# Patient Record
Sex: Male | Born: 1980 | Race: White | Hispanic: No | Marital: Married | Smoking: Current every day smoker
Health system: Southern US, Community
[De-identification: ages and names within clinical notes are randomized; demographics above are authoritative.]

## PROBLEM LIST (undated history)

## (undated) DIAGNOSIS — I509 Heart failure, unspecified: Secondary | ICD-10-CM

## (undated) DIAGNOSIS — G4730 Sleep apnea, unspecified: Secondary | ICD-10-CM

## (undated) DIAGNOSIS — J449 Chronic obstructive pulmonary disease, unspecified: Secondary | ICD-10-CM

---

## 2019-01-21 ENCOUNTER — Encounter (HOSPITAL_COMMUNITY): Payer: Self-pay | Admitting: *Deleted

## 2019-01-21 ENCOUNTER — Emergency Department (HOSPITAL_COMMUNITY)
Admission: EM | Admit: 2019-01-21 | Discharge: 2019-01-22 | Disposition: A | Discharge status: Home / Self Care | Payer: Medicaid Other | Attending: Emergency Medicine | Admitting: Emergency Medicine

## 2019-01-21 ENCOUNTER — Emergency Department (HOSPITAL_COMMUNITY): Payer: Medicaid Other

## 2019-01-21 DIAGNOSIS — I509 Heart failure, unspecified: Secondary | ICD-10-CM | POA: Diagnosis not present

## 2019-01-21 DIAGNOSIS — N5089 Other specified disorders of the male genital organs: Secondary | ICD-10-CM | POA: Diagnosis not present

## 2019-01-21 DIAGNOSIS — R600 Localized edema: Secondary | ICD-10-CM

## 2019-01-21 DIAGNOSIS — J449 Chronic obstructive pulmonary disease, unspecified: Secondary | ICD-10-CM | POA: Insufficient documentation

## 2019-01-21 DIAGNOSIS — I110 Hypertensive heart disease with heart failure: Secondary | ICD-10-CM | POA: Insufficient documentation

## 2019-01-21 DIAGNOSIS — R2243 Localized swelling, mass and lump, lower limb, bilateral: Secondary | ICD-10-CM | POA: Insufficient documentation

## 2019-01-21 DIAGNOSIS — I10 Essential (primary) hypertension: Secondary | ICD-10-CM

## 2019-01-21 DIAGNOSIS — Z91199 Patient's noncompliance with other medical treatment and regimen due to unspecified reason: Secondary | ICD-10-CM

## 2019-01-21 DIAGNOSIS — F17210 Nicotine dependence, cigarettes, uncomplicated: Secondary | ICD-10-CM | POA: Insufficient documentation

## 2019-01-21 DIAGNOSIS — R739 Hyperglycemia, unspecified: Secondary | ICD-10-CM | POA: Insufficient documentation

## 2019-01-21 DIAGNOSIS — R0602 Shortness of breath: Secondary | ICD-10-CM | POA: Diagnosis present

## 2019-01-21 DIAGNOSIS — Z9119 Patient's noncompliance with other medical treatment and regimen: Secondary | ICD-10-CM

## 2019-01-21 HISTORY — DX: Chronic obstructive pulmonary disease, unspecified: J44.9

## 2019-01-21 HISTORY — DX: Sleep apnea, unspecified: G47.30

## 2019-01-21 HISTORY — DX: Heart failure, unspecified: I50.9

## 2019-01-21 LAB — BASIC METABOLIC PANEL
Anion gap: 10 (ref 5–15)
BUN: 11 mg/dL (ref 6–20)
CO2: 30 mmol/L (ref 22–32)
Calcium: 8.6 mg/dL — ABNORMAL LOW (ref 8.9–10.3)
Chloride: 97 mmol/L — ABNORMAL LOW (ref 98–111)
Creatinine, Ser: 0.95 mg/dL (ref 0.61–1.24)
GFR calc Af Amer: >60 mL/min (ref >60)
GFR calc non Af Amer: >60 mL/min (ref >60)
Glucose, Bld: 264 mg/dL — ABNORMAL HIGH (ref 70–99)
Potassium: 4 mmol/L (ref 3.5–5.1)
Sodium: 137 mmol/L (ref 135–145)

## 2019-01-21 LAB — CBC
HCT: 50.8 % (ref 39.0–52.0)
Hemoglobin: 15.5 g/dL (ref 13.0–17.0)
MCH: 25.7 pg — ABNORMAL LOW (ref 26.0–34.0)
MCHC: 30.5 g/dL (ref 30.0–36.0)
MCV: 84.2 fL (ref 80.0–100.0)
Platelets: 147 10*3/uL — ABNORMAL LOW (ref 150–400)
RBC: 6.03 MIL/uL — ABNORMAL HIGH (ref 4.22–5.81)
RDW: 17.8 % — ABNORMAL HIGH (ref 11.5–15.5)
WBC: 8.1 10*3/uL (ref 4.0–10.5)
nRBC: 0 % (ref 0.0–0.2)

## 2019-01-21 LAB — TROPONIN I (HIGH SENSITIVITY): Troponin I (High Sensitivity): 12 ng/L (ref <18)

## 2019-01-21 LAB — BRAIN NATRIURETIC PEPTIDE: B Natriuretic Peptide: 26.6 pg/mL (ref 0.0–100.0)

## 2019-01-21 MED ORDER — SODIUM CHLORIDE 0.9% FLUSH: 3.0000 mL | Freq: Once | INTRAVENOUS | Status: DC

## 2019-01-21 NOTE — ED Triage Notes (Signed)
Pt here pov for sob.  States was admitted for heart failure in Aug where they removed 40 lbs of fluid.  Did not follow up with cardiology and has only been taking part of his meds.  Was seen by pcp today, who sent him here.  States LE and abdominal edema and sob.  Pt has 70 lbs weight gain since last hospital stay.

## 2019-01-21 NOTE — ED Notes (Signed)
Pt O2 sat 84%, triage RN notified, pt placed on 2L improved to 92%.

## 2019-01-22 LAB — TROPONIN I (HIGH SENSITIVITY): Troponin I (High Sensitivity): 11 ng/L (ref <18)

## 2019-01-22 MED ORDER — METFORMIN HCL 500 MG PO TABS: 500.0000 mg | ORAL_TABLET | Freq: Two times a day (BID) | ORAL | 0 refills | Status: AC

## 2019-01-22 MED ORDER — FUROSEMIDE 20 MG PO TABS: 20.0000 mg | ORAL_TABLET | Freq: Every day | ORAL | 0 refills | Status: AC

## 2019-01-22 MED ORDER — AMLODIPINE BESYLATE 5 MG PO TABS
10.0000 mg | ORAL_TABLET | Freq: Once | ORAL | Status: AC
  Administered 2019-01-22: 11:00:00 10 mg via ORAL
  Filled 2019-01-22: qty 2

## 2019-01-22 MED ORDER — FUROSEMIDE 20 MG PO TABS
80.0000 mg | ORAL_TABLET | Freq: Once | ORAL | Status: AC
  Administered 2019-01-22: 11:00:00 80 mg via ORAL
  Filled 2019-01-22: qty 4

## 2019-01-22 MED ORDER — LISINOPRIL 5 MG PO TABS: 10.0000 mg | ORAL_TABLET | Freq: Every day | ORAL | 0 refills | Status: AC

## 2019-01-22 MED ORDER — FUROSEMIDE 10 MG/ML IJ SOLN: 60.0000 mg | Freq: Once | INTRAMUSCULAR | Status: DC

## 2019-01-22 MED ORDER — AMLODIPINE BESYLATE 10 MG PO TABS: 10.0000 mg | ORAL_TABLET | Freq: Every day | ORAL | 0 refills | Status: AC

## 2019-01-22 NOTE — ED Provider Notes (Signed)
[facility] EMERGENCY DEPARTMENT Provider Note   CSN: [id number] Arrival date & time: 01/21/19  1620     History Chief Complaint  Patient presents with  . Shortness of Breath    Jason Roman is a 39 y.o. male.  Patient c/o progressive weight increase in the past 5 months. States was briefly hospitalized in 07/2018 with excess fluid, and was treated with lasix then - states since d/c from hospital, not compliant w bp meds, lasix, heart healthy diet and pcp f/u, and that has slowly had progressive weight increase and fluid retention since. No acute or abrupt change today or this week. Patient with bil leg and scrotal swelling. ?orthopnea. No pnd. +dyspnea with exertion.  No current or recent chest pain or discomfort. No fever or chills. Symptoms have been gradual onset, moderate, persistent, slowly worse.   The history is provided by the patient.  Shortness of Breath Associated symptoms: no abdominal pain, no chest pain, no cough, no fever, no headaches, no neck pain, no rash, no sore throat and no vomiting        Past Medical History:  Diagnosis Date  . CHF (congestive heart failure) (HCC)   . COPD (chronic obstructive pulmonary disease) (HCC)   . Sleep apnea     There are no problems to display for this patient.   History reviewed. No pertinent surgical history.     No family history on file.  Social History   Tobacco Use  . Smoking status: Current Every Day Smoker    Packs/day: 2.00    Types: Cigarettes  . Smokeless tobacco: Never Used  Substance Use Topics  . Alcohol use: Yes    Comment: occ  . Drug use: Never    Home Medications Prior to Admission medications   Not on File    Allergies    Vancomycin  Review of Systems   Review of Systems  Constitutional: Negative for fever.  HENT: Negative for sore throat.   Eyes: Negative for redness.  Respiratory: Positive for shortness of breath. Negative for cough.   Cardiovascular:  Positive for leg swelling. Negative for chest pain and palpitations.  Gastrointestinal: Negative for abdominal pain, constipation, diarrhea and vomiting.  Genitourinary: Negative for flank pain.  Musculoskeletal: Negative for back pain and neck pain.  Skin: Negative for rash.  Neurological: Negative for headaches.  Hematological: Does not bruise/bleed easily.  Psychiatric/Behavioral: Negative for confusion.    Physical Exam Updated Vital Signs BP (!) 154/108 (BP Location: Left Arm)   Pulse 81   Temp 98 F (36.7 C) (Oral)   Resp 20   Ht 1.753 m (5\' 9" )   Wt (!) 167.8 kg   SpO2 92%   BMI 54.64 kg/m   Physical Exam Vitals and nursing note reviewed.  Constitutional:      Appearance: Normal appearance. He is well-developed.  HENT:     Head: Atraumatic.     Nose: Nose normal.     Mouth/Throat:     Mouth: Mucous membranes are moist.     Pharynx: Oropharynx is clear.  Eyes:     General: No scleral icterus.    Conjunctiva/sclera: Conjunctivae normal.  Neck:     Trachea: No tracheal deviation.  Cardiovascular:     Rate and Rhythm: Normal rate and regular rhythm.     Pulses: Normal pulses.     Heart sounds: Normal heart sounds. No murmur. No friction rub. No gallop.   Pulmonary:     Effort: Pulmonary  effort is normal. No accessory muscle usage or respiratory distress.     Breath sounds: Normal breath sounds.  Abdominal:     General: Bowel sounds are normal. There is no distension.     Palpations: Abdomen is soft.     Tenderness: There is no abdominal tenderness. There is no guarding.     Comments: Obese.  Genitourinary:    Comments: No cva tenderness. Musculoskeletal:     Cervical back: Normal range of motion and neck supple. No rigidity.     Right lower leg: Edema present.     Left lower leg: Edema present.     Comments: Symmetric bil leg and scrotal swelling.   Skin:    General: Skin is warm and dry.     Findings: No rash.  Neurological:     Mental Status: He is  alert.     Comments: Alert, speech clear.   Psychiatric:        Mood and Affect: Mood normal.     ED Results / Procedures / Treatments   Labs (all labs ordered are listed, but only abnormal results are displayed) Results for orders placed or performed during the hospital encounter of 01/21/19  Basic metabolic panel  Result Value Ref Range   Sodium 137 135 - 145 mmol/L   Potassium 4.0 3.5 - 5.1 mmol/L   Chloride 97 (L) 98 - 111 mmol/L   CO2 30 22 - 32 mmol/L   Glucose, Bld 264 (H) 70 - 99 mg/dL   BUN 11 6 - 20 mg/dL   Creatinine, Ser 2.59 0.61 - 1.24 mg/dL   Calcium 8.6 (L) 8.9 - 10.3 mg/dL   GFR calc non Af Amer >60 >60 mL/min   GFR calc Af Amer >60 >60 mL/min   Anion gap 10 5 - 15  CBC  Result Value Ref Range   WBC 8.1 4.0 - 10.5 K/uL   RBC 6.03 (H) 4.22 - 5.81 MIL/uL   Hemoglobin 15.5 13.0 - 17.0 g/dL   HCT 56.3 87.5 - 64.3 %   MCV 84.2 80.0 - 100.0 fL   MCH 25.7 (L) 26.0 - 34.0 pg   MCHC 30.5 30.0 - 36.0 g/dL   RDW 32.9 (H) 51.8 - 84.1 %   Platelets 147 (L) 150 - 400 K/uL   nRBC 0.0 0.0 - 0.2 %  Brain natriuretic peptide  Result Value Ref Range   B Natriuretic Peptide 26.6 0.0 - 100.0 pg/mL  Troponin I (High Sensitivity)  Result Value Ref Range   Troponin I (High Sensitivity) 12 <18 ng/L  Troponin I (High Sensitivity)  Result Value Ref Range   Troponin I (High Sensitivity) 11 <18 ng/L   DG Chest 2 View  Result Date: 01/21/2019 CLINICAL DATA:  Shortness of breath EXAM: CHEST - 2 VIEW COMPARISON:  None. FINDINGS: There is borderline cardiomegaly with mild volume overload. There is no large pneumothorax. No large pleural effusion. No focal infiltrate. IMPRESSION: Borderline cardiomegaly with mild volume overload. Electronically Signed   By: Katherine Mantle M.D.   On: 01/21/2019 17:06    EKG EKG Interpretation  Date/Time:  Monday January 21 2019 16:32:40 EST Ventricular Rate:  89 PR Interval:  156 QRS Duration: 96 QT Interval:  378 QTC Calculation: 459 R  Axis:   -74 Text Interpretation: Normal sinus rhythm Left axis deviation Abnormal ECG No old tracing to compare Confirmed by Dione Booze ([id number]) on 01/22/2019 1:20:07 AM   Radiology DG Chest 2 View  Result Date: 01/21/2019 CLINICAL  DATA:  Shortness of breath EXAM: CHEST - 2 VIEW COMPARISON:  None. FINDINGS: There is borderline cardiomegaly with mild volume overload. There is no large pneumothorax. No large pleural effusion. No focal infiltrate. IMPRESSION: Borderline cardiomegaly with mild volume overload. Electronically Signed   By: Constance Holster M.D.   On: 01/21/2019 17:06    Procedures Procedures (including critical care time)  Medications Ordered in ED Medications  sodium chloride flush (NS) 0.9 % injection 3 mL (has no administration in time range)  furosemide (LASIX) injection 60 mg (has no administration in time range)  amLODipine (NORVASC) tablet 10 mg (has no administration in time range)    ED Course  I have reviewed the triage vital signs and the nursing notes.  Pertinent labs & imaging results that were available during my care of the patient were reviewed by me and considered in my medical decision making (see chart for details).    MDM Rules/Calculators/A&P                      Iv ns. Labs sent. Monitor/pulse ox.   Reviewed nursing notes and prior charts for additional history.   Patient is drinking one regular [facility], and has another empty bottle on stretcher. Pt notes not compliant w medical therapy, meds, diet, and pcp f/u. States is from [place], and does have pcp, but has states hasnt seen recently. Discussed importance of meds, heart healthy eating plan, and f/u for recheck, bp and glucose management, etc.   Will give dose iv lasix in ED.   Labs reviewed/interpreted by me - bnp is normal. hgb normal. Glucose mod elev, hc03 normal. Trop normal.   Bp is elevated - in addition to lasix, patient given dose of his bp medication.   Patient refuses iv  meds/admission, requests po meds and d/c.   Patients room air pulse ox is now 94-96%, he is breathing comfortably, talking in complete sentences, and talking on cell phone.   Rec close pcp f/u, medication and eating plan compliance.  Return precautions provided.    Final Clinical Impression(s) / ED Diagnoses Final diagnoses:  None    Rx / DC Orders ED Discharge Orders    None       Lajean Saver, MD 01/22/19 [id number]

## 2019-01-22 NOTE — ED Notes (Signed)
Pt currently in hallway and is concerned about getting a room and a meal.   Would like to talk to his wife before proceeding with care at this time.  Alert and oriented in NAD.  Currently on oxygen.

## 2019-01-22 NOTE — Discharge Instructions (Addendum)
It was our pleasure to provide your ER care today - we hope that you feel better.  Take your medications as prescribed. Follow a diabetic, heart healthy, low sodium meal plan.   Follow up with primary care doctor in the coming week - call office today to arrange appointment. Make sure to have your blood pressure and blood sugar rechecked then, as both are high today.   Return to ER if worse, new symptoms, increased trouble breathing, chest pain, or other concern.

## 2020-06-24 IMAGING — DX DG CHEST 2V
2 series · 2 of 2 positions shown · non-contrast
Comparison: None.

CLINICAL DATA: Shortness of breath

EXAM:
CHEST - 2 VIEW

[chest pa]
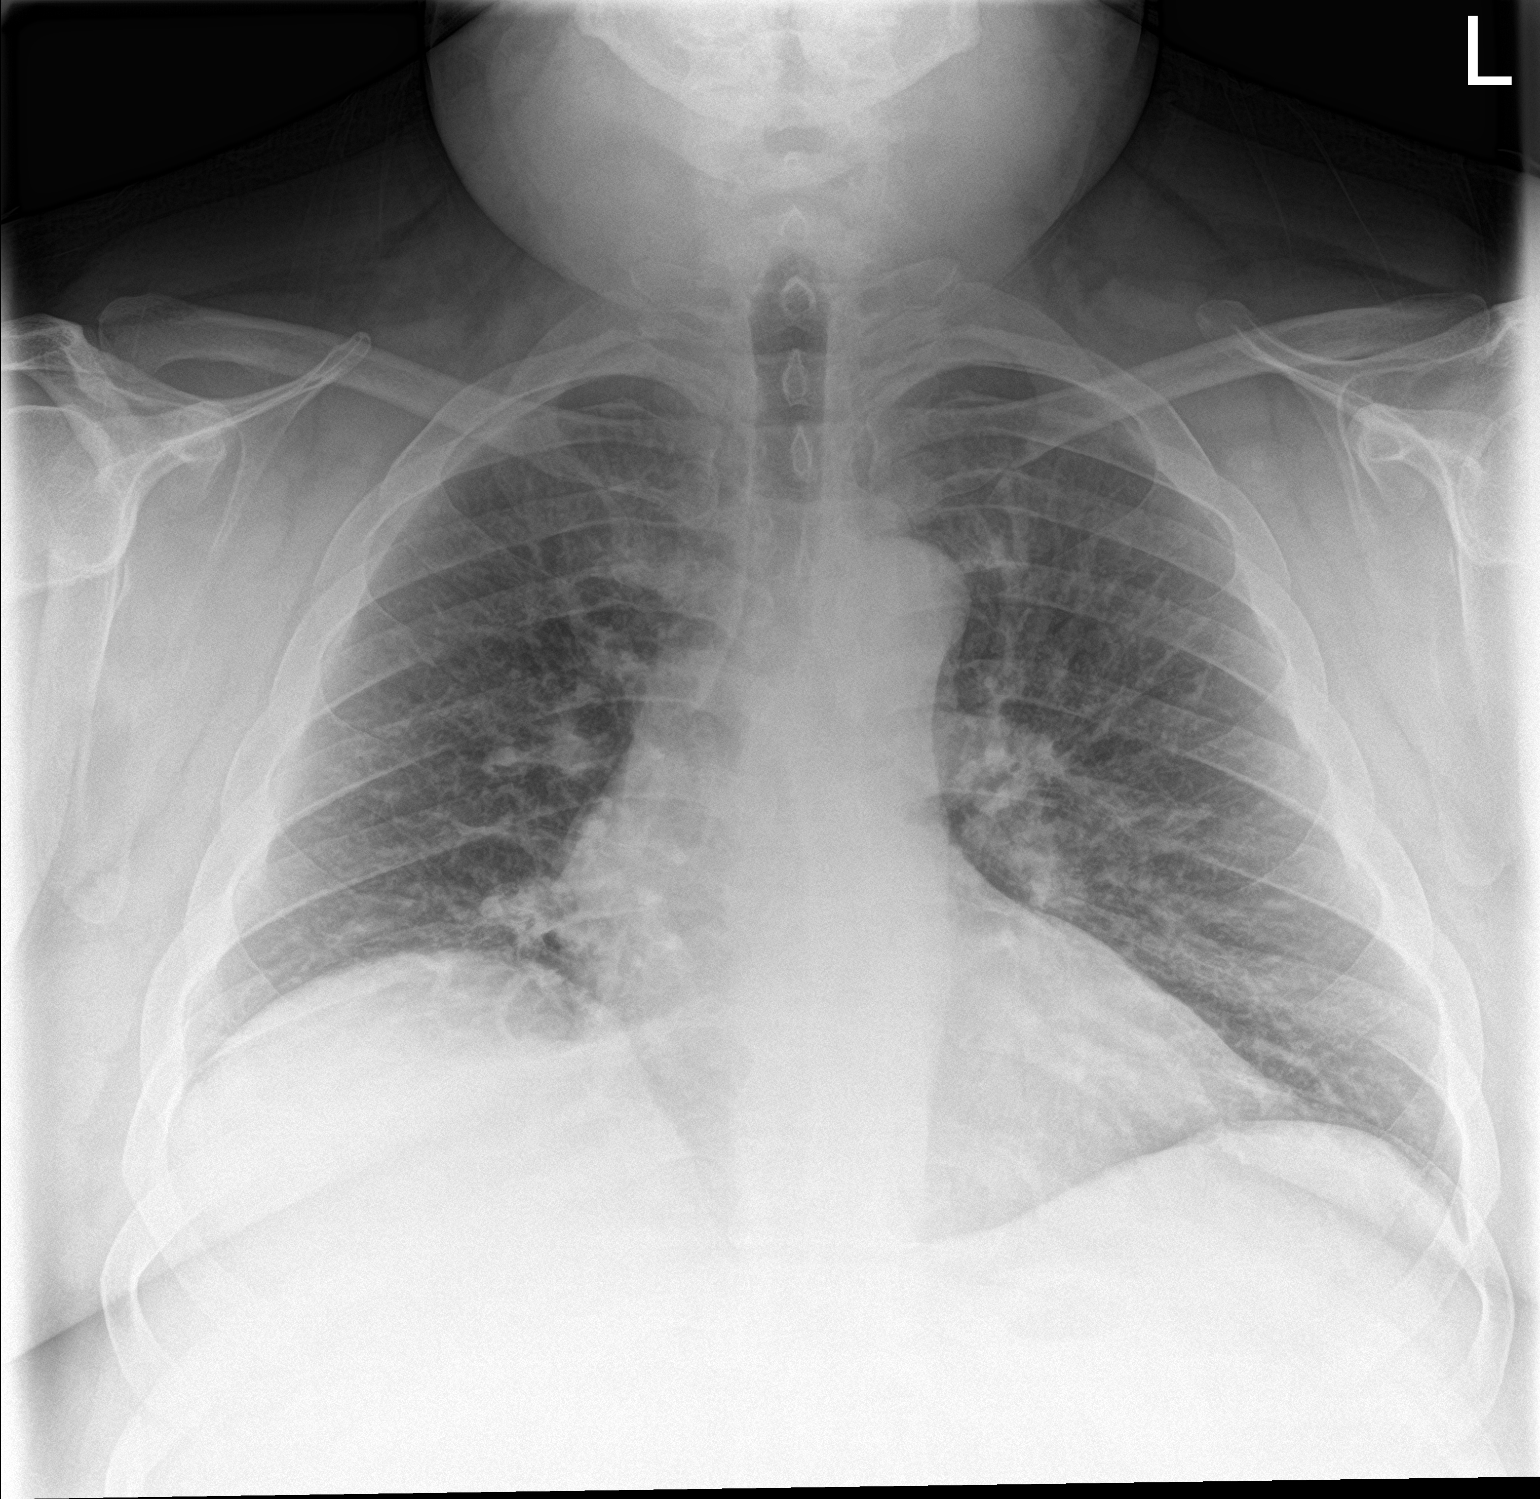

[chest lat]
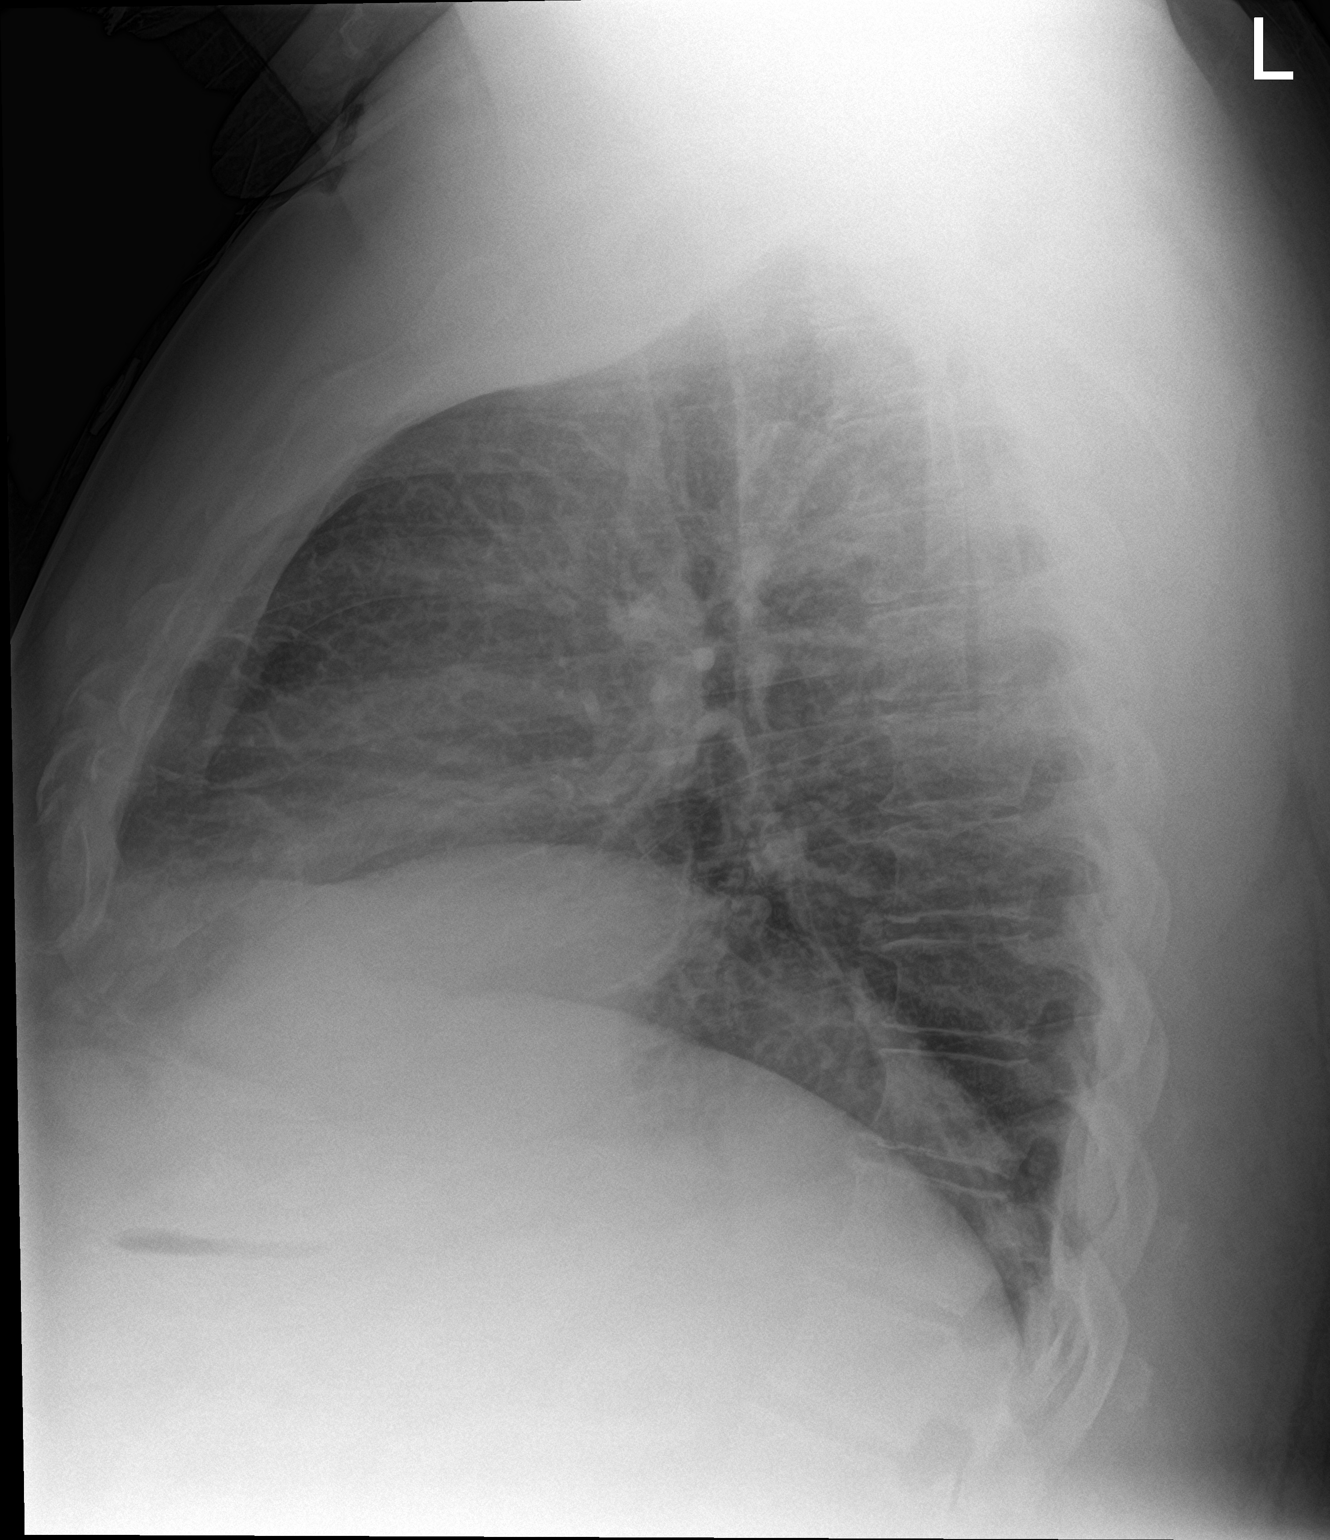

[2 of 2 positions shown; findings below may reference images not displayed]

FINDINGS: There is borderline cardiomegaly with mild volume overload. There is
no large pneumothorax. No large pleural effusion. No focal
infiltrate.
IMPRESSION: Borderline cardiomegaly with mild volume overload.
